# Patient Record
Sex: Male | Born: 1990 | Race: White | Hispanic: No | Marital: Single | Smoking: Current every day smoker
Health system: Southern US, Community
[De-identification: ages and names within clinical notes are randomized; demographics above are authoritative.]

## PROBLEM LIST (undated history)

## (undated) HISTORY — PX: FRACTURE SURGERY: SHX138

---

## 2018-01-29 ENCOUNTER — Emergency Department (HOSPITAL_COMMUNITY)
Admission: EM | Admit: 2018-01-29 | Discharge: 2018-01-29 | Disposition: A | Discharge status: Home / Self Care | Payer: Self-pay | Attending: Emergency Medicine | Admitting: Emergency Medicine

## 2018-01-29 ENCOUNTER — Encounter (HOSPITAL_COMMUNITY): Payer: Self-pay | Admitting: Emergency Medicine

## 2018-01-29 ENCOUNTER — Other Ambulatory Visit: Payer: Self-pay

## 2018-01-29 DIAGNOSIS — Z23 Encounter for immunization: Secondary | ICD-10-CM | POA: Insufficient documentation

## 2018-01-29 DIAGNOSIS — Y929 Unspecified place or not applicable: Secondary | ICD-10-CM | POA: Insufficient documentation

## 2018-01-29 DIAGNOSIS — T07XXXA Unspecified multiple injuries, initial encounter: Secondary | ICD-10-CM

## 2018-01-29 DIAGNOSIS — S60512A Abrasion of left hand, initial encounter: Secondary | ICD-10-CM | POA: Insufficient documentation

## 2018-01-29 DIAGNOSIS — S30810A Abrasion of lower back and pelvis, initial encounter: Secondary | ICD-10-CM | POA: Insufficient documentation

## 2018-01-29 DIAGNOSIS — Y9389 Activity, other specified: Secondary | ICD-10-CM | POA: Insufficient documentation

## 2018-01-29 DIAGNOSIS — S80212A Abrasion, left knee, initial encounter: Secondary | ICD-10-CM | POA: Insufficient documentation

## 2018-01-29 DIAGNOSIS — Y999 Unspecified external cause status: Secondary | ICD-10-CM | POA: Insufficient documentation

## 2018-01-29 MED ORDER — TETANUS-DIPHTH-ACELL PERTUSSIS 5-2.5-18.5 LF-MCG/0.5 IM SUSP
0.5000 mL | Freq: Once | INTRAMUSCULAR | Status: AC
  Administered 2018-01-29: 0.5 mL via INTRAMUSCULAR
  Filled 2018-01-29: qty 0.5
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
## 2018-01-29 NOTE — Discharge Instructions (Addendum)
Clean the abrasions with soap and water once or twice a day then apply a light coating of antibiotic ointment.  For pain take ibuprofen or Tylenol.  Return here or see the doctor of your choice as needed for problems.

## 2018-04-21 ENCOUNTER — Emergency Department (HOSPITAL_COMMUNITY): Payer: Self-pay

## 2018-04-21 ENCOUNTER — Emergency Department (HOSPITAL_COMMUNITY)
Admission: EM | Admit: 2018-04-21 | Discharge: 2018-04-21 | Disposition: A | Discharge status: Home / Self Care | Payer: Self-pay | Attending: Emergency Medicine | Admitting: Emergency Medicine

## 2018-04-21 ENCOUNTER — Other Ambulatory Visit: Payer: Self-pay

## 2018-04-21 ENCOUNTER — Encounter (HOSPITAL_COMMUNITY): Payer: Self-pay | Admitting: Emergency Medicine

## 2018-04-21 DIAGNOSIS — R197 Diarrhea, unspecified: Secondary | ICD-10-CM | POA: Insufficient documentation

## 2018-04-21 DIAGNOSIS — R112 Nausea with vomiting, unspecified: Secondary | ICD-10-CM | POA: Insufficient documentation

## 2018-04-21 DIAGNOSIS — F17210 Nicotine dependence, cigarettes, uncomplicated: Secondary | ICD-10-CM | POA: Insufficient documentation

## 2018-04-21 LAB — URINALYSIS, ROUTINE W REFLEX MICROSCOPIC
BILIRUBIN URINE: NEGATIVE
GLUCOSE, UA: NEGATIVE mg/dL
HGB URINE DIPSTICK: NEGATIVE
KETONES UR: NEGATIVE mg/dL
Leukocytes, UA: NEGATIVE
Nitrite: NEGATIVE
PH: 7 (ref 5.0–8.0)
Protein, ur: NEGATIVE mg/dL
SPECIFIC GRAVITY, URINE: 1.004 — AB (ref 1.005–1.030)

## 2018-04-21 LAB — CBC WITH DIFFERENTIAL/PLATELET
BASOS PCT: 0 %
Basophils Absolute: 0 10*3/uL (ref 0.0–0.1)
Eosinophils Absolute: 0.2 10*3/uL (ref 0.0–0.7)
Eosinophils Relative: 2 %
HEMATOCRIT: 47.2 % (ref 39.0–52.0)
HEMOGLOBIN: 16.9 g/dL (ref 13.0–17.0)
LYMPHS ABS: 2.1 10*3/uL (ref 0.7–4.0)
LYMPHS PCT: 22 %
MCH: 31.6 pg (ref 26.0–34.0)
MCHC: 35.8 g/dL (ref 30.0–36.0)
MCV: 88.4 fL (ref 78.0–100.0)
MONO ABS: 0.6 10*3/uL (ref 0.1–1.0)
MONOS PCT: 6 %
NEUTROS ABS: 6.6 10*3/uL (ref 1.7–7.7)
NEUTROS PCT: 70 %
Platelets: 193 10*3/uL (ref 150–400)
RBC: 5.34 MIL/uL (ref 4.22–5.81)
RDW: 11.9 % (ref 11.5–15.5)
WBC: 9.5 10*3/uL (ref 4.0–10.5)

## 2018-04-21 LAB — COMPREHENSIVE METABOLIC PANEL
ALBUMIN: 4 g/dL (ref 3.5–5.0)
ALT: 14 U/L (ref 0–44)
AST: 16 U/L (ref 15–41)
Alkaline Phosphatase: 48 U/L (ref 38–126)
Anion gap: 6 (ref 5–15)
BILIRUBIN TOTAL: 0.9 mg/dL (ref 0.3–1.2)
BUN: 12 mg/dL (ref 6–20)
CO2: 27 mmol/L (ref 22–32)
Calcium: 9 mg/dL (ref 8.9–10.3)
Chloride: 105 mmol/L (ref 98–111)
Creatinine, Ser: 1 mg/dL (ref 0.61–1.24)
GFR calc Af Amer: >60 mL/min (ref >60)
GFR calc non Af Amer: >60 mL/min (ref >60)
GLUCOSE: 103 mg/dL — AB (ref 70–99)
Potassium: 3.8 mmol/L (ref 3.5–5.1)
SODIUM: 138 mmol/L (ref 135–145)
TOTAL PROTEIN: 7 g/dL (ref 6.5–8.1)

## 2018-04-21 LAB — LIPASE, BLOOD: Lipase: 54 U/L — ABNORMAL HIGH (ref 11–51)

## 2018-04-21 MED ORDER — ONDANSETRON HCL 4 MG/2ML IJ SOLN
4.0000 mg | Freq: Once | INTRAMUSCULAR | Status: AC
  Administered 2018-04-21: 4 mg via INTRAVENOUS
  Filled 2018-04-21: qty 2

## 2018-04-21 MED ORDER — ONDANSETRON HCL 4 MG PO TABS: 4.0000 mg | ORAL_TABLET | Freq: Four times a day (QID) | ORAL | 0 refills | Status: AC

## 2018-04-21 MED ORDER — IOPAMIDOL (ISOVUE-300) INJECTION 61%
100.0000 mL | Freq: Once | INTRAVENOUS | Status: AC | PRN
  Administered 2018-04-21: 100 mL via INTRAVENOUS

## 2018-04-21 MED ORDER — SODIUM CHLORIDE 0.9 % IV BOLUS
1000.0000 mL | Freq: Once | INTRAVENOUS | Status: AC
  Administered 2018-04-21: 1000 mL via INTRAVENOUS

## 2018-04-21 NOTE — ED Notes (Signed)
Pt asking for urinal

## 2018-04-21 NOTE — Discharge Instructions (Signed)
Frequent small sips of clear fluids today, then bland diet as tolerated.  Follow-up with your primary doctor or return here for any worsening symptoms such as increasing pain, fever, or continued vomiting and diarrhea lasting more than 3-4 more days

## 2018-04-21 NOTE — ED Triage Notes (Signed)
Pt states vomiting and diarrhea since Wednesday off and on. Bloating feeling. States family sick with same. Pt reports weakness

## 2018-04-21 NOTE — ED Notes (Signed)
Pt in CT.

## 2018-04-21 NOTE — ED Provider Notes (Signed)
[facility] EMERGENCY DEPARTMENT Provider Note   CSN: [id number] Arrival date & time: 04/21/18  [id number]     History   Chief Complaint No chief complaint on file.   HPI Ron ParkerRobert Pruss is a 27 y.o. male.  HPI     Ron ParkerRobert Buzby is a 27 y.o. male who presents to the Emergency Department complaining of vomiting, diarrhea, and generalized weakness.  Symptoms have been present for 1 week.  He reports vomiting and diarrhea have been intermittent and associated with some mild tenderness of his lower abdomen.  He states that he is unable to eat solid foods without vomiting.  He is tolerating small amounts of water.  He describes a "bloating" feeling of his abdomen.  He reports weakness after vomiting.  He denies fever, chills, urinary symptoms, back pain and recent antibiotics.  He states that his sister recently had similar symptoms.  No history of abdominal surgeries.    No past medical history on file.  There are no active problems to display for this patient.   Past Surgical History:  Procedure Laterality Date  . FRACTURE SURGERY        Home Medications    Prior to Admission medications   Not on File    Family History No family history on file.  Social History Social History   Tobacco Use  . Smoking status: Current Every Day Smoker    Packs/day: 0.50    Types: Cigarettes  . Smokeless tobacco: Current User    Types: Snuff  Substance Use Topics  . Alcohol use: Yes  . Drug use: Never     Allergies   Codeine; Penicillins; Erythromycin; and Sulfa antibiotics   Review of Systems Review of Systems  Constitutional: Positive for fatigue. Negative for appetite change, chills and fever.  HENT: Negative for sore throat.   Respiratory: Negative for shortness of breath.   Cardiovascular: Negative for chest pain.  Gastrointestinal: Positive for abdominal pain, diarrhea, nausea and vomiting. Negative for blood in stool.  Genitourinary: Negative for decreased urine  volume, difficulty urinating, dysuria and flank pain.  Musculoskeletal: Negative for back pain.  Skin: Negative for color change and rash.  Neurological: Negative for dizziness, weakness (generalized weakness) and numbness.  Hematological: Negative for adenopathy.  All other systems reviewed and are negative.    Physical Exam Updated Vital Signs BP 135/90 (BP Location: Right Arm)   Pulse 85   Temp 98.7 F (37.1 C) (Oral)   Resp 18   Ht 6' (1.829 m)   Wt 88.5 kg (195 lb)   SpO2 100%   BMI 26.45 kg/m   Physical Exam  Constitutional: He appears well-developed and well-nourished. No distress.  HENT:  Head: Normocephalic and atraumatic.  Mucous membranes are dry  Eyes: Conjunctivae are normal.  Cardiovascular: Normal rate, regular rhythm and intact distal pulses.  No murmur heard. Pulmonary/Chest: Effort normal and breath sounds normal. No respiratory distress. He exhibits no tenderness.  Abdominal: Soft. Bowel sounds are normal. He exhibits no distension and no mass. There is tenderness. There is no rebound and no guarding.  Mild ttp of the RLQ.  Abdomen is otherwise soft.  No guarding or rebound tenderness.   Musculoskeletal: Normal range of motion. He exhibits no edema.  Neurological: He is alert. No sensory deficit. Coordination normal.  Skin: Skin is warm. Capillary refill takes less than 2 seconds. No rash noted.  Psychiatric: He has a normal mood and affect.  Nursing note and vitals reviewed.    ED  Treatments / Results  Labs (all labs ordered are listed, but only abnormal results are displayed) Labs Reviewed  COMPREHENSIVE METABOLIC PANEL - Abnormal; Notable for the following components:      Result Value   Glucose, Bld 103 (*)    All other components within normal limits  LIPASE, BLOOD - Abnormal; Notable for the following components:   Lipase 54 (*)    All other components within normal limits  URINALYSIS, ROUTINE W REFLEX MICROSCOPIC - Abnormal; Notable for  the following components:   Color, Urine STRAW (*)    Specific Gravity, Urine 1.004 (*)    All other components within normal limits  CBC WITH DIFFERENTIAL/PLATELET    EKG None  Radiology Ct Abdomen Pelvis W Contrast  Result Date: 04/21/2018 CLINICAL DATA:  Right lower quadrant abdominal pain for 4 days. No fever, diarrhea or constipation. No previous relevant surgery. EXAM: CT ABDOMEN AND PELVIS WITH CONTRAST TECHNIQUE: Multidetector CT imaging of the abdomen and pelvis was performed using the standard protocol following bolus administration of intravenous contrast. CONTRAST:  ISOVUE-300 IOPAMIDOL (ISOVUE-300) INJECTION 61% COMPARISON:  None. FINDINGS: Lower chest: Clear lung bases. No significant pleural or pericardial effusion. Hepatobiliary: The liver is normal in density without focal abnormality. No evidence of gallstones, gallbladder wall thickening or biliary dilatation. Pancreas: Unremarkable. No pancreatic ductal dilatation or surrounding inflammatory changes. Spleen: Normal in size without focal abnormality. Adrenals/Urinary Tract: Both adrenal glands appear normal. 9 mm low-density lesion in the lower pole of the left kidney on image 36/2 is likely a small cyst. The kidneys otherwise appear normal. No evidence of urinary tract calculus, hydronephrosis or perinephric soft tissue stranding. The bladder appears normal. Stomach/Bowel: No evidence of bowel wall thickening, distention or surrounding inflammatory change. The appendix appears normal. Vascular/Lymphatic: There are no enlarged abdominal or pelvic lymph nodes. No significant vascular findings. Reproductive: The prostate gland and seminal vesicles appear normal. Other: No evidence of abdominal wall mass or hernia. No ascites. Musculoskeletal: No acute or significant osseous findings. 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 IMPRESSION: No acute findings or explanation for the patient's  symptoms. The appendix appears normal. Electronically Signed   By: Carey Bullocks M.D.   On: 04/21/2018 10:50     Procedures Procedures (including critical care time)  Medications Ordered in ED Medications - No data to display   Initial Impression / Assessment and Plan / ED Course  I have reviewed the triage vital signs and the nursing notes.  Pertinent labs & imaging results that were available during my care of the patient were reviewed by me and considered in my medical decision making (see chart for details).     Patient with intermittent vomiting and diarrhea for 1 week.  Well-appearing.  Afebrile.  No history of recent antibiotic use.  Tolerating small amounts of water.  Right lower quadrant pain on exam, this is concerning for possible appendicitis will obtain labs and CT abdomen pelvis.  On recheck, patient reports feeling better.  He has tolerated crackers and oral fluids.  Labs and CT of abdomen pelvis are reassuring.  Feel that symptoms are viral.  No diarrhea during ER stay.  Patient agrees to symptomatic treatment plan and close PCP follow-up, I have discussed return precautions.    Final Clinical Impressions(s) / ED Diagnoses   Final diagnoses:  Nausea vomiting and diarrhea    ED Discharge Orders    None       Pauline Aus, PA-C 04/24/18 1522  Benjiman Core, MD 04/24/18 1616

## 2018-11-01 ENCOUNTER — Other Ambulatory Visit (HOSPITAL_COMMUNITY): Payer: Self-pay | Admitting: Preventative Medicine

## 2018-11-01 DIAGNOSIS — S9032XA Contusion of left foot, initial encounter: Secondary | ICD-10-CM

## 2018-11-02 ENCOUNTER — Encounter (HOSPITAL_COMMUNITY)
Admission: RE | Admit: 2018-11-02 | Discharge: 2018-11-02 | Disposition: A | Discharge status: Home / Self Care | Payer: Self-pay | Source: Ambulatory Visit | Attending: Preventative Medicine | Admitting: Preventative Medicine

## 2018-11-02 DIAGNOSIS — S9032XA Contusion of left foot, initial encounter: Secondary | ICD-10-CM | POA: Insufficient documentation

## 2018-11-02 MED ORDER — TECHNETIUM TC 99M MEDRONATE IV KIT
20.0000 | PACK | Freq: Once | INTRAVENOUS | Status: AC | PRN
  Administered 2018-11-02: 18 via INTRAVENOUS

## 2018-11-30 ENCOUNTER — Emergency Department (HOSPITAL_COMMUNITY)
Admission: EM | Admit: 2018-11-30 | Discharge: 2018-12-01 | Disposition: A | Discharge status: Home / Self Care | Payer: Self-pay | Attending: Emergency Medicine | Admitting: Emergency Medicine

## 2018-11-30 ENCOUNTER — Other Ambulatory Visit: Payer: Self-pay

## 2018-11-30 ENCOUNTER — Encounter (HOSPITAL_COMMUNITY): Payer: Self-pay | Admitting: Emergency Medicine

## 2018-11-30 DIAGNOSIS — F17210 Nicotine dependence, cigarettes, uncomplicated: Secondary | ICD-10-CM | POA: Insufficient documentation

## 2018-11-30 DIAGNOSIS — J00 Acute nasopharyngitis [common cold]: Secondary | ICD-10-CM | POA: Insufficient documentation

## 2018-11-30 LAB — GROUP A STREP BY PCR: Group A Strep by PCR: NOT DETECTED

## 2018-11-30 NOTE — ED Triage Notes (Signed)
Patient complaining of sore throat x 2 days. 

## 2018-12-01 MED ORDER — BENZONATATE 100 MG PO CAPS
200.0000 mg | ORAL_CAPSULE | Freq: Once | ORAL | Status: AC
  Administered 2018-12-01: 200 mg via ORAL
  Filled 2018-12-01: qty 2

## 2018-12-01 MED ORDER — BENZONATATE 100 MG PO CAPS: 200.0000 mg | ORAL_CAPSULE | Freq: Three times a day (TID) | ORAL | 0 refills | Status: AC | PRN

## 2018-12-01 NOTE — Discharge Instructions (Addendum)
Rest,  Drink plenty of fluids.  Take motrin or tylenol if needed for throat pain and fever reduction.  Tessalon perles can help with your cough symptom.   Get rechecked for any worsening symptoms including shortness of breath or weakness.

## 2018-12-01 NOTE — ED Provider Notes (Signed)
[facility] EMERGENCY DEPARTMENT Provider Note   CSN: [id number] Arrival date & time: 11/30/18  2202    History   Chief Complaint Chief Complaint  Patient presents with  . Sore Throat    HPI Clinton Flynn is a 28 y.o. male  Presenting with a 2 day history of uri type symptoms which includes nasal congestion with clear rhinorrhea, sore throat and a mild nonproductive cough.  His throat pain is worse when he first wakes in the morning and is associated with episodic laryngitis which improves throughout the day.  Symptoms do not include shortness of breath, chest pain,  Nausea, vomiting or diarrhea.  The patient has use warm salt gargles  prior to arrival with no significant improvement in symptoms.      The history is provided by the patient.    History reviewed. No pertinent past medical history.  There are no active problems to display for this patient.   Past Surgical History:  Procedure Laterality Date  . FRACTURE SURGERY          Home Medications    Prior to Admission medications   Medication Sig Start Date End Date Taking? Authorizing Provider  benzonatate (TESSALON) 100 MG capsule Take 2 capsules (200 mg total) by mouth 3 (three) times daily as needed. 12/01/18   Haadi Santellan, Raynelle Fanning, PA-C  ondansetron (ZOFRAN) 4 MG tablet Take 1 tablet (4 mg total) by mouth every 6 (six) hours. 04/21/18   Pauline Aus, PA-C    Family History History reviewed. No pertinent family history.  Social History Social History   Tobacco Use  . Smoking status: Current Every Day Smoker    Packs/day: 0.50    Types: Cigarettes  . Smokeless tobacco: Current User    Types: Snuff  Substance Use Topics  . Alcohol use: Yes    Comment: occasionally  . Drug use: Never     Allergies   Codeine; Penicillins; Erythromycin; and Sulfa antibiotics   Review of Systems Review of Systems  Constitutional: Negative for chills and fever.  HENT: Positive for congestion, rhinorrhea, sore throat  and voice change. Negative for ear pain, sinus pressure and trouble swallowing.   Eyes: Negative for discharge.  Respiratory: Positive for cough. Negative for shortness of breath, wheezing and stridor.   Cardiovascular: Negative for chest pain.  Gastrointestinal: Negative for abdominal pain.  Genitourinary: Negative.      Physical Exam Updated Vital Signs BP 126/81   Pulse 76   Temp 98.1 F (36.7 C) (Oral)   Resp 18   Ht 6' (1.829 m)   Wt 90.7 kg   SpO2 98%   BMI 27.12 kg/m   Physical Exam Constitutional:      Appearance: He is well-developed.  HENT:     Head: Normocephalic and atraumatic.     Right Ear: Tympanic membrane and ear canal normal.     Left Ear: Tympanic membrane and ear canal normal.     Nose: Mucosal edema and rhinorrhea present.     Mouth/Throat:     Mouth: Mucous membranes are moist.     Pharynx: Uvula midline. Posterior oropharyngeal erythema present. No oropharyngeal exudate or uvula swelling.     Tonsils: No tonsillar abscesses.  Eyes:     Conjunctiva/sclera: Conjunctivae normal.  Cardiovascular:     Rate and Rhythm: Normal rate.     Heart sounds: Normal heart sounds.  Pulmonary:     Effort: Pulmonary effort is normal. No respiratory distress.     Breath sounds: No  stridor. No wheezing, rhonchi or rales.  Musculoskeletal: Normal range of motion.  Skin:    General: Skin is warm and dry.     Findings: No rash.  Neurological:     Mental Status: He is alert and oriented to person, place, and time.      ED Treatments / Results  Labs (all labs ordered are listed, but only abnormal results are displayed) Labs Reviewed  GROUP A STREP BY PCR    EKG None  Radiology No results found.  Procedures Procedures (including critical care time)  Medications Ordered in ED Medications  benzonatate (TESSALON) capsule 200 mg (200 mg Oral Given 12/01/18 0021)     Initial Impression / Assessment and Plan / ED Course  I have reviewed the triage vital  signs and the nursing notes.  Pertinent labs & imaging results that were available during my care of the patient were reviewed by me and considered in my medical decision making (see chart for details).        Strep negative. Exam and hx suggesting viral uri. Discussed supportive home care tx for sx. Motrin or tylenol, suggested otc  Antihistamine/decongestant. Tessalon for cough. Prn f/u anticipated.  Final Clinical Impressions(s) / ED Diagnoses   Final diagnoses:  Acute nasopharyngitis    ED Discharge Orders         Ordered    benzonatate (TESSALON) 100 MG capsule  3 times daily PRN     12/01/18 0005           Burgess Amor, PA-C 12/01/18 1228    Glynn Octave, MD 12/01/18 [phone]

## 2020-08-17 IMAGING — NM NM BONE 3 PHASE
7 of 10 series · 13 of 30 positions shown · non-contrast
Comparison: None

Radiographic correlation: None

CLINICAL DATA: LEFT foot pain, redness and warmth after working all
day, slab of granite fell on LEFT foot 1 week ago, LEFT foot
contusion initial encounter

EXAM:
NUCLEAR MEDICINE 3-PHASE BONE SCAN
TECHNIQUE: Radionuclide angiographic images, immediate static blood pool
images, and 3-hour delayed static images were obtained of the feet
after intravenous injection of radiopharmaceutical.
RADIOPHARMACEUTICALS:  None mCi Hc-55m MDP IV

[Series 1: (id) pages 1 at 2_18_(id) 12_[id number] pm · 3 of 10 frames shown]
[frame 3/10]
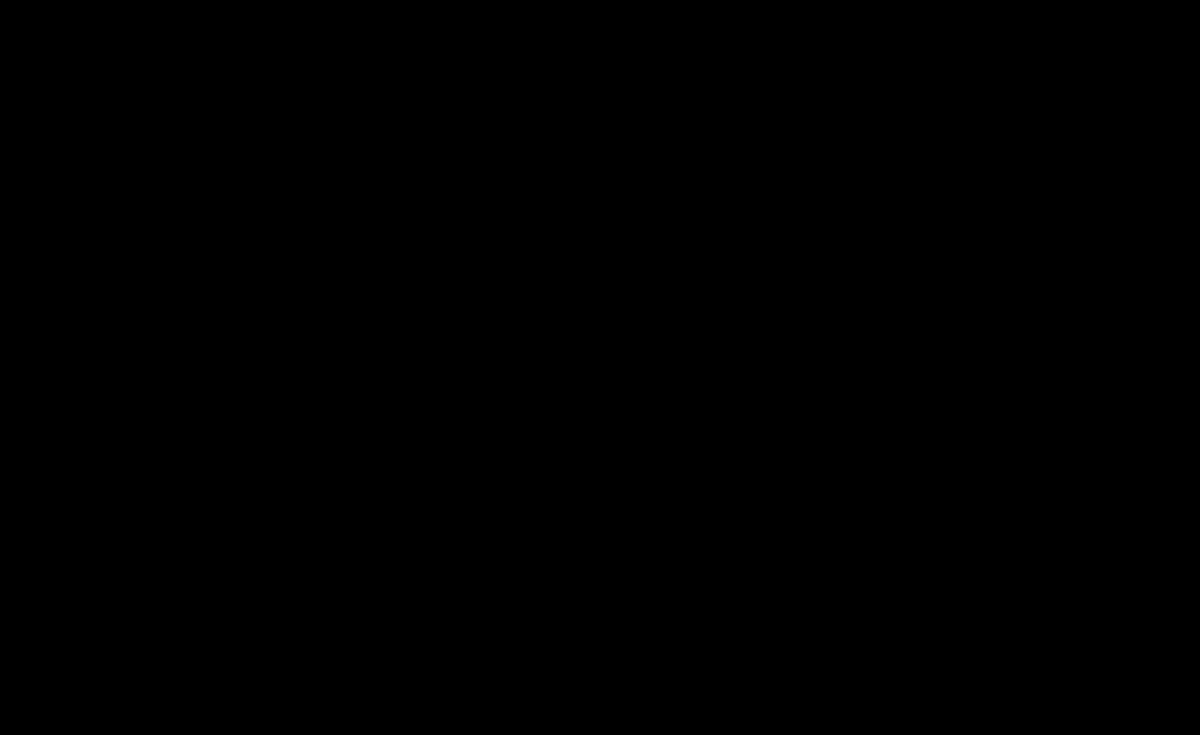
[frame 6/10]
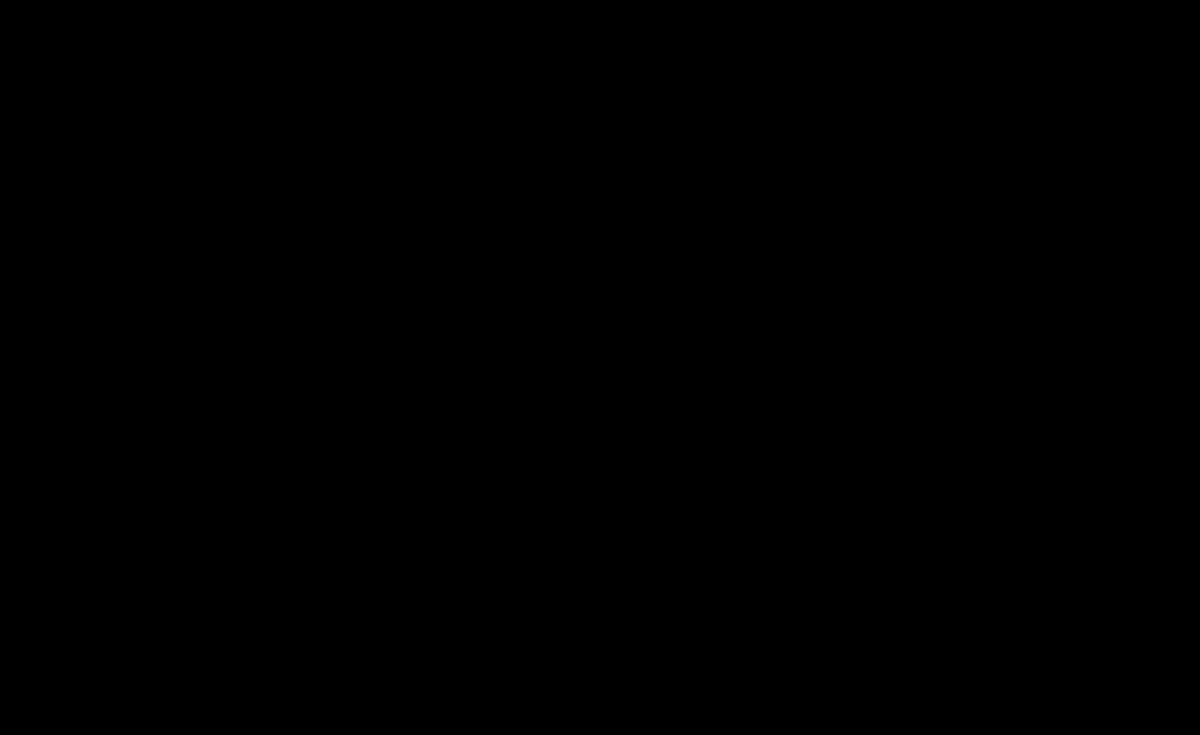
[frame 10/10]
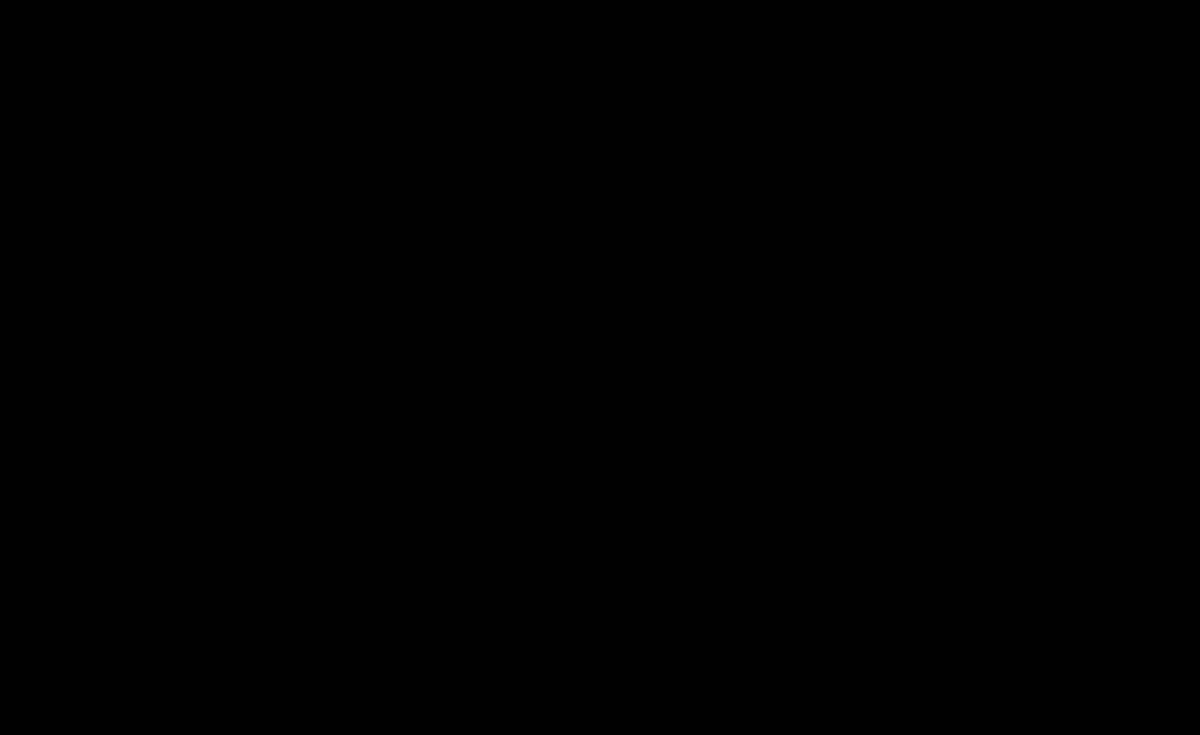

[Series 1: (id) pages 2 at 2_18_(id) 12_[id number] pm · 2 of 10 frames shown]
[frame 5/10]
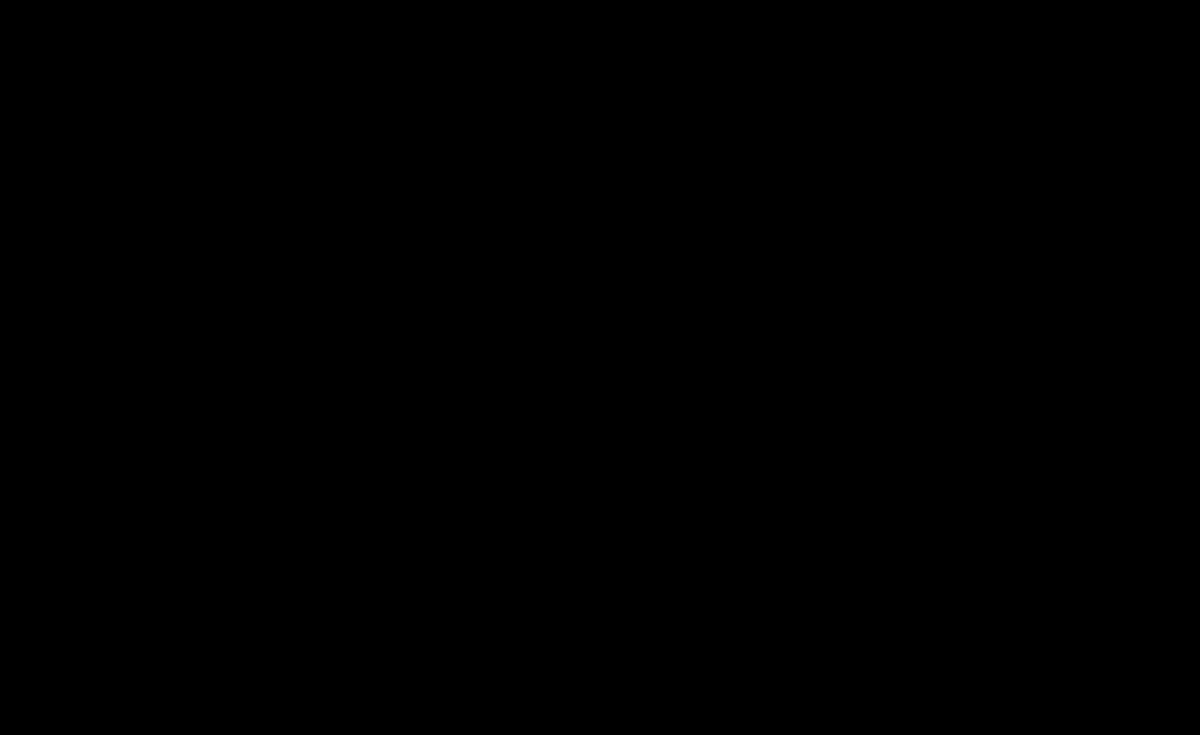
[frame 8/10]
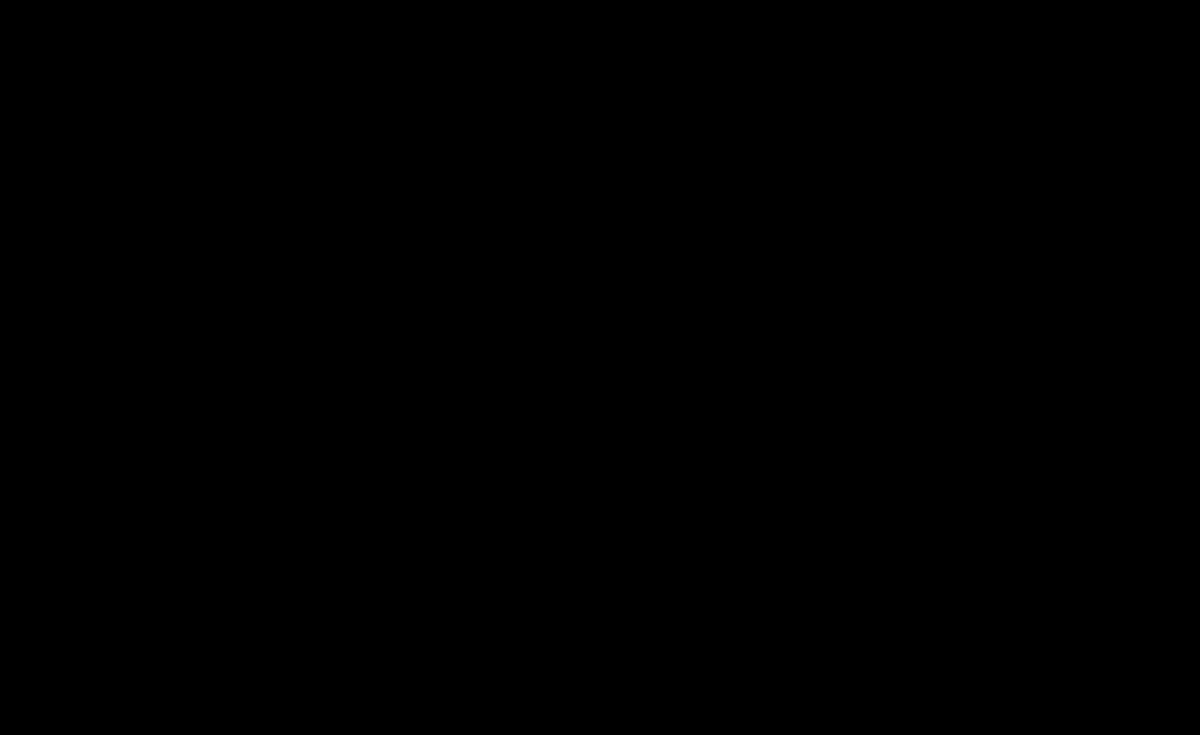

[Series 1: flow · 2.07mm/px · 3 of 15 frames shown (1 of 2)]
[frame 2/15]
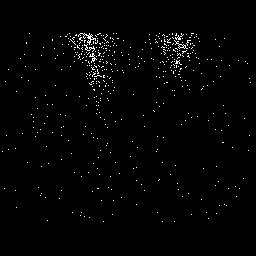
[frame 9/15]
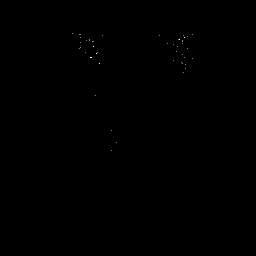
[frame 14/15]
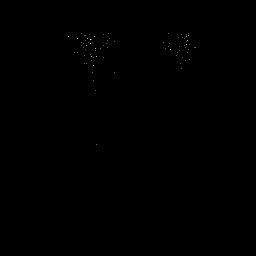

[Series 1: flow · 2.07mm/px · 2 of 15 frames shown (2 of 2)]
[frame 4/15]
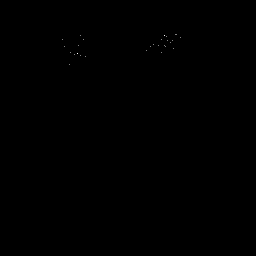
[frame 9/15]
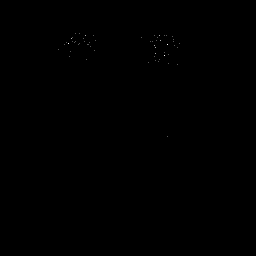

[Series 2: blood pool · 2.07mm/px · 1 of 1 slices shown]
[im 1/1  full-range]
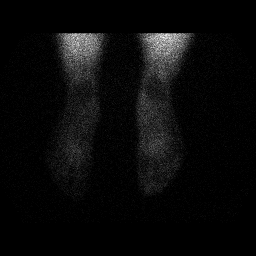

[Series 3: delay · delayed · 2.07mm/px · 1 of 1 slices shown (1 of 2)]
[im 1/1  full-range]
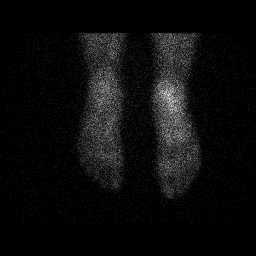

[Series 4: delay · delayed · 2.07mm/px · 1 of 1 slices shown (2 of 2)]
[im 1/1  full-range]
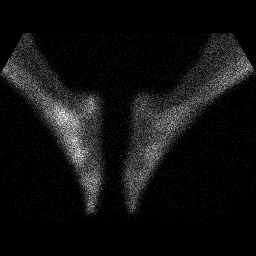

[13 of 30 positions shown; findings below may reference images not displayed]

FINDINGS: Vascular phase: Minimally increased blood flow to RIGHT foot
diffusely versus LEFT

Blood pool phase: Minimally increased blood pool in RIGHT foot
versus LEFT

Delayed phase: Mildly increased tracer localization throughout
osseous structures of the RIGHT foot versus LEFT compatible with
mild RIGHT foot hyperemia and increased delivery of tracer. No focal
areas of increased osseous tracer accumulation are identified in
either foot to suggest occult fracture or infection.
IMPRESSION: Mild hyperemia to the RIGHT foot versus LEFT on blood flow and blood
pool images, which likely accounts for mild diffuse increase in
tracer localization throughout the RIGHT foot versus LEFT.

No focal areas of abnormal osseous tracer accumulation are seen in
either foot to suggest occult fracture or infection.

If patient has persistent focal symptoms, consider MR imaging.

## 2021-01-30 ENCOUNTER — Emergency Department (HOSPITAL_COMMUNITY)
Admission: EM | Admit: 2021-01-30 | Discharge: 2021-01-30 | Disposition: A | Discharge status: Home / Self Care | Payer: Self-pay | Attending: Emergency Medicine | Admitting: Emergency Medicine

## 2021-01-30 ENCOUNTER — Emergency Department (HOSPITAL_COMMUNITY): Payer: Self-pay

## 2021-01-30 ENCOUNTER — Other Ambulatory Visit: Payer: Self-pay

## 2021-01-30 ENCOUNTER — Encounter (HOSPITAL_COMMUNITY): Payer: Self-pay

## 2021-01-30 DIAGNOSIS — F17210 Nicotine dependence, cigarettes, uncomplicated: Secondary | ICD-10-CM | POA: Insufficient documentation

## 2021-01-30 DIAGNOSIS — N132 Hydronephrosis with renal and ureteral calculous obstruction: Secondary | ICD-10-CM | POA: Insufficient documentation

## 2021-01-30 DIAGNOSIS — N200 Calculus of kidney: Secondary | ICD-10-CM

## 2021-01-30 LAB — URINALYSIS, ROUTINE W REFLEX MICROSCOPIC
Bacteria, UA: NONE SEEN
Bilirubin Urine: NEGATIVE
Glucose, UA: NEGATIVE mg/dL
Ketones, ur: NEGATIVE mg/dL
Leukocytes,Ua: NEGATIVE
Nitrite: NEGATIVE
Protein, ur: 100 mg/dL — AB
RBC / HPF: >50 RBC/hpf — ABNORMAL HIGH (ref 0–5)
Specific Gravity, Urine: 1.021 (ref 1.005–1.030)
pH: 6 (ref 5.0–8.0)

## 2021-01-30 LAB — CBC WITH DIFFERENTIAL/PLATELET
Abs Immature Granulocytes: 0.05 10*3/uL (ref 0.00–0.07)
Basophils Absolute: 0.1 10*3/uL (ref 0.0–0.1)
Basophils Relative: 1 %
Eosinophils Absolute: 0.3 10*3/uL (ref 0.0–0.5)
Eosinophils Relative: 2 %
HCT: 48.7 % (ref 39.0–52.0)
Hemoglobin: 16.3 g/dL (ref 13.0–17.0)
Immature Granulocytes: 0 %
Lymphocytes Relative: 37 %
Lymphs Abs: 5.1 10*3/uL — ABNORMAL HIGH (ref 0.7–4.0)
MCH: 30.4 pg (ref 26.0–34.0)
MCHC: 33.5 g/dL (ref 30.0–36.0)
MCV: 90.9 fL (ref 80.0–100.0)
Monocytes Absolute: 0.9 10*3/uL (ref 0.1–1.0)
Monocytes Relative: 7 %
Neutro Abs: 7.3 10*3/uL (ref 1.7–7.7)
Neutrophils Relative %: 53 %
Platelets: 294 10*3/uL (ref 150–400)
RBC: 5.36 MIL/uL (ref 4.22–5.81)
RDW: 12.1 % (ref 11.5–15.5)
WBC: 13.8 10*3/uL — ABNORMAL HIGH (ref 4.0–10.5)
nRBC: 0 % (ref 0.0–0.2)

## 2021-01-30 LAB — BASIC METABOLIC PANEL
Anion gap: 9 (ref 5–15)
BUN: 16 mg/dL (ref 6–20)
CO2: 28 mmol/L (ref 22–32)
Calcium: 9.3 mg/dL (ref 8.9–10.3)
Chloride: 102 mmol/L (ref 98–111)
Creatinine, Ser: 1.16 mg/dL (ref 0.61–1.24)
GFR, Estimated: >60 mL/min (ref >60)
Glucose, Bld: 115 mg/dL — ABNORMAL HIGH (ref 70–99)
Potassium: 3 mmol/L — ABNORMAL LOW (ref 3.5–5.1)
Sodium: 139 mmol/L (ref 135–145)

## 2021-01-30 MED ORDER — ONDANSETRON HCL 4 MG/2ML IJ SOLN
4.0000 mg | Freq: Once | INTRAMUSCULAR | Status: AC
  Administered 2021-01-30: 4 mg via INTRAVENOUS
  Filled 2021-01-30: qty 2

## 2021-01-30 MED ORDER — SODIUM CHLORIDE 0.9 % IV BOLUS
1000.0000 mL | Freq: Once | INTRAVENOUS | Status: AC
  Administered 2021-01-30: 1000 mL via INTRAVENOUS

## 2021-01-30 MED ORDER — KETOROLAC TROMETHAMINE 30 MG/ML IJ SOLN
15.0000 mg | Freq: Once | INTRAMUSCULAR | Status: AC
  Administered 2021-01-30: 15 mg via INTRAVENOUS
  Filled 2021-01-30: qty 1

## 2021-01-30 MED ORDER — ONDANSETRON 4 MG PO TBDP: 4.0000 mg | ORAL_TABLET | Freq: Three times a day (TID) | ORAL | 0 refills | Status: AC | PRN

## 2021-01-30 MED ORDER — MORPHINE SULFATE (PF) 4 MG/ML IV SOLN
4.0000 mg | Freq: Once | INTRAVENOUS | Status: AC
  Administered 2021-01-30: 4 mg via INTRAVENOUS
  Filled 2021-01-30: qty 1

## 2021-01-30 MED ORDER — OXYCODONE-ACETAMINOPHEN 5-325 MG PO TABS: 1.0000 | ORAL_TABLET | Freq: Four times a day (QID) | ORAL | 0 refills | Status: AC | PRN

## 2021-01-30 NOTE — ED Provider Notes (Signed)
[facility] EMERGENCY DEPARTMENT Provider Note   CSN: [id number] Arrival date & time: 01/30/21  1245     History Chief Complaint  Patient presents with  . Flank Pain    Left Flank Pain    Clinton Flynn is a 30 y.o. male.  HPI     This is a 30 year old male with no reported past medical history presents with left flank pain.  Reports onset of symptoms around 230 this morning.  He reports acute onset of symptoms.  He states that he has 10 out of 10 pain in the left flank that radiates into the left lower abdomen.  He reports associated urinary frequency and dysuria.  He states "I feel like something strain to pass."  He has no personal history of kidney stones but states that they run in his family.  He is not had any fevers.  He is not taking anything for the pain.  He reports nausea without vomiting.  History reviewed. No pertinent past medical history.  There are no problems to display for this patient.   Past Surgical History:  Procedure Laterality Date  . FRACTURE SURGERY         History reviewed. No pertinent family history.  Social History   Tobacco Use  . Smoking status: Current Every Day Smoker    Packs/day: 0.50    Types: Cigarettes  . Smokeless tobacco: Current User    Types: Snuff  Substance Use Topics  . Alcohol use: Yes    Comment: occasionally  . Drug use: Never    Home Medications Prior to Admission medications   Medication Sig Start Date End Date Taking? Authorizing Provider  ondansetron (ZOFRAN ODT) 4 MG disintegrating tablet Take 1 tablet (4 mg total) by mouth every 8 (eight) hours as needed for nausea or vomiting. 01/30/21  Yes Jon Lall, Mayer Masker, MD  oxyCODONE-acetaminophen (PERCOCET/ROXICET) 5-325 MG tablet Take 1 tablet by mouth every 6 (six) hours as needed for severe pain. 01/30/21  Yes Deshannon Seide, Mayer Masker, MD  benzonatate (TESSALON) 100 MG capsule Take 2 capsules (200 mg total) by mouth 3 (three) times daily as needed. 12/01/18   Idol,  Raynelle Fanning, PA-C  ondansetron (ZOFRAN) 4 MG tablet Take 1 tablet (4 mg total) by mouth every 6 (six) hours. 04/21/18   Triplett, Tammy, PA-C    Allergies    Codeine, Penicillins, Erythromycin, and Sulfa antibiotics  Review of Systems   Review of Systems  Constitutional: Negative for fever.  Respiratory: Negative for shortness of breath.   Gastrointestinal: Positive for nausea. Negative for abdominal pain.  Genitourinary: Positive for dysuria, flank pain and frequency.  All other systems reviewed and are negative.   Physical Exam Updated Vital Signs BP 132/86 (BP Location: Left Arm)   Pulse 86   Temp 98 F (36.7 C) (Oral)   Ht 1.753 m (5\' 9" )   Wt 90.7 kg   SpO2 100%   BMI 29.53 kg/m   Physical Exam Vitals and nursing note reviewed.  Constitutional:      Appearance: He is well-developed.     Comments: Uncomfortable appearing but nontoxic  HENT:     Head: Normocephalic and atraumatic.     Nose: Nose normal.     Mouth/Throat:     Mouth: Mucous membranes are moist.  Eyes:     Pupils: Pupils are equal, round, and reactive to light.  Cardiovascular:     Rate and Rhythm: Normal rate and regular rhythm.     Heart sounds: Normal heart  sounds. No murmur heard.   Pulmonary:     Effort: Pulmonary effort is normal. No respiratory distress.     Breath sounds: Normal breath sounds. No wheezing.  Abdominal:     General: Bowel sounds are normal.     Palpations: Abdomen is soft.     Tenderness: There is no abdominal tenderness. There is left CVA tenderness. There is no right CVA tenderness or rebound.  Musculoskeletal:     Cervical back: Neck supple.     Right lower leg: No edema.     Left lower leg: No edema.  Lymphadenopathy:     Cervical: No cervical adenopathy.  Skin:    General: Skin is warm and dry.  Neurological:     Mental Status: He is alert and oriented to person, place, and time.  Psychiatric:        Mood and Affect: Mood normal.     ED Results / Procedures /  Treatments   Labs (all labs ordered are listed, but only abnormal results are displayed) Labs Reviewed  URINALYSIS, ROUTINE W REFLEX MICROSCOPIC - Abnormal; Notable for the following components:      Result Value   APPearance HAZY (*)    Hgb urine dipstick LARGE (*)    Protein, ur 100 (*)    RBC / HPF >50 (*)    All other components within normal limits  CBC WITH DIFFERENTIAL/PLATELET - Abnormal; Notable for the following components:   WBC 13.8 (*)    Lymphs Abs 5.1 (*)    All other components within normal limits  BASIC METABOLIC PANEL - Abnormal; Notable for the following components:   Potassium 3.0 (*)    Glucose, Bld 115 (*)    All other components within normal limits    EKG None  Radiology CT Renal Stone Study  Result Date: 01/30/2021 CLINICAL DATA:  Left flank pain, dysuria EXAM: CT ABDOMEN AND PELVIS WITHOUT CONTRAST TECHNIQUE: Multidetector CT imaging of the abdomen and pelvis was performed following the standard protocol without IV contrast. COMPARISON:  04/21/2018 FINDINGS: Lower chest: The visualized lung bases are clear. The visualized heart and pericardium are unremarkable. Hepatobiliary: No focal liver abnormality is seen. No gallstones, gallbladder wall thickening, or biliary dilatation. Pancreas: Unremarkable Spleen: Unremarkable Adrenals/Urinary Tract: The adrenal glands are unremarkable. The kidneys are normal in size and position. There is mild left hydronephrosis secondary to an obstructing 3 mm calculus within the distal left ureter just proximal to the left ureterovesicular junction. No additional nephro or urolithiasis. No hydronephrosis on the right. The bladder is largely decompressed and is unremarkable. Stomach/Bowel: Stomach is within normal limits. Appendix appears normal. No evidence of bowel wall thickening, distention, or inflammatory changes. Vascular/Lymphatic: The abdominal vasculature is unremarkable. No pathologic adenopathy within the abdomen and  pelvis. Reproductive: Prostate is unremarkable. Other: No abdominal wall hernia. Musculoskeletal: No acute bone abnormality. No lytic or blastic bone lesions are identified. Multiple Schmorl's nodes are noted within the visualized lower thoracic spine. IMPRESSION: 3 mm obstructing calculus within the distal left ureter resulting in mild left hydronephrosis. Electronically Signed   By: Helyn Numbers MD   On: 01/30/2021 04:22    Procedures Procedures   Medications Ordered in ED Medications  sodium chloride 0.9 % bolus 1,000 mL (1,000 mLs Intravenous New Bag/Given 01/30/21 0346)  morphine 4 MG/ML injection 4 mg (4 mg Intravenous Given 01/30/21 0347)  ondansetron (ZOFRAN) injection 4 mg (4 mg Intravenous Given 01/30/21 0347)  ketorolac (TORADOL) 30 MG/ML injection 15 mg (15  mg Intravenous Given 01/30/21 0347)    ED Course  I have reviewed the triage vital signs and the nursing notes.  Pertinent labs & imaging results that were available during my care of the patient were reviewed by me and considered in my medical decision making (see chart for details).    MDM Rules/Calculators/A&P                          Patient presents with left flank pain.  He is overall nontoxic and vital signs are reassuring.  He does appear uncomfortable.  History is most consistent with likely kidney stone.  UTI is also a consideration.  Patient was given pain and nausea medication as well as fluids.  Labs were obtained as well as a CT stone study.  Urinalysis with red cells present.  CBC with slight leukocytosis but no left shift.  K slightly low at 3.0.  CT stone study shows a 3 mm obstructing left stone.  This is likely the cause of symptoms.  No evidence of superimposed UTI.  On recheck, patient is significantly improved.  We discussed expectant management.  Will discharge with some pain medication and Zofran.  He was provided with urology follow-up.  After history, exam, and medical workup I feel the patient has been  appropriately medically screened and is safe for discharge home. Pertinent diagnoses were discussed with the patient. Patient was given return precautions.  Final Clinical Impression(s) / ED Diagnoses Final diagnoses:  Kidney stone    Rx / DC Orders ED Discharge Orders         Ordered    oxyCODONE-acetaminophen (PERCOCET/ROXICET) 5-325 MG tablet  Every 6 hours PRN        01/30/21 0452    ondansetron (ZOFRAN ODT) 4 MG disintegrating tablet  Every 8 hours PRN        01/30/21 0452           Shon Baton, MD 01/30/21 [phone]

## 2021-01-30 NOTE — ED Notes (Signed)
Patient vomited ~422ml clear yellow liquid.

## 2021-01-30 NOTE — Discharge Instructions (Addendum)
You have a 3 mm kidney stone.  This will likely pass on its own.  Make sure you are staying hydrated.  Take medications as prescribed.  Follow-up with urology if pain returns or worsens.

## 2021-01-30 NOTE — ED Triage Notes (Signed)
Left flank pain that started at 0230 this am. + Dysuria Denies fever, denies blood in urine, denies hx of similar episodes. Has not taken ibuprofen or tylenol.

## 2021-01-30 NOTE — ED Notes (Signed)
Nausea relieved  

## 2021-01-30 NOTE — ED Triage Notes (Signed)
Pt taking selfies in triage, pain 10/10.

## 2021-04-16 ENCOUNTER — Other Ambulatory Visit: Payer: Self-pay

## 2021-04-16 ENCOUNTER — Emergency Department (HOSPITAL_COMMUNITY)
Admission: EM | Admit: 2021-04-16 | Discharge: 2021-04-16 | Disposition: A | Discharge status: Home / Self Care | Payer: Self-pay | Attending: Emergency Medicine | Admitting: Emergency Medicine

## 2021-04-16 ENCOUNTER — Encounter (HOSPITAL_COMMUNITY): Payer: Self-pay | Admitting: Emergency Medicine

## 2021-04-16 DIAGNOSIS — F17210 Nicotine dependence, cigarettes, uncomplicated: Secondary | ICD-10-CM | POA: Insufficient documentation

## 2021-04-16 DIAGNOSIS — Z79899 Other long term (current) drug therapy: Secondary | ICD-10-CM | POA: Insufficient documentation

## 2021-04-16 DIAGNOSIS — Z0283 Encounter for blood-alcohol and blood-drug test: Secondary | ICD-10-CM | POA: Insufficient documentation

## 2021-04-16 LAB — RAPID URINE DRUG SCREEN, HOSP PERFORMED
Amphetamines: NOT DETECTED
Barbiturates: NOT DETECTED
Benzodiazepines: NOT DETECTED
Cocaine: NOT DETECTED
Opiates: NOT DETECTED
Tetrahydrocannabinol: NOT DETECTED

## 2021-04-16 NOTE — ED Provider Notes (Signed)
[facility] EMERGENCY DEPARTMENT Provider Note   CSN: [id number] Arrival date & time: 04/16/21  1201     History Chief Complaint  Patient presents with   Drug / Alcohol Assessment    Harvir Patry is a 30 y.o. male.  This is a 30 yo male with history as below presenting for drug assessment.  Patient reports that he does not have his wallet/driver's license.  He is requested that he obtain a drug test by his employer for a job.  Patient denies acute medical complaints this time.  Denies recent drug or alcohol use.  Patient reports that he was instructed to go to emergency department by his employer to obtain the screening test.  Patient denies chest pain, dyspnea, abdominal pain, nausea or vomiting, no change in bowel or bladder function.  No recent medication or dietary changes. No acute systemic complaints offered.  The history is provided by the patient. No language interpreter was used.  Drug / Alcohol Assessment Associated symptoms: no abdominal pain, no palpitations, no seizures, no shortness of breath and no vomiting       History reviewed. No pertinent past medical history.  There are no problems to display for this patient.   Past Surgical History:  Procedure Laterality Date   FRACTURE SURGERY         History reviewed. No pertinent family history.  Social History   Tobacco Use   Smoking status: Every Day    Packs/day: 0.50    Types: Cigarettes   Smokeless tobacco: Current    Types: Snuff  Substance Use Topics   Alcohol use: Yes    Comment: occasionally   Drug use: Never    Home Medications Prior to Admission medications   Medication Sig Start Date End Date Taking? Authorizing Provider  benzonatate (TESSALON) 100 MG capsule Take 2 capsules (200 mg total) by mouth 3 (three) times daily as needed. 12/01/18   Idol, Raynelle Fanning, PA-C  ondansetron (ZOFRAN ODT) 4 MG disintegrating tablet Take 1 tablet (4 mg total) by mouth every 8 (eight) hours as needed for  nausea or vomiting. 01/30/21   Horton, Mayer Masker, MD  ondansetron (ZOFRAN) 4 MG tablet Take 1 tablet (4 mg total) by mouth every 6 (six) hours. 04/21/18   Triplett, Tammy, PA-C  oxyCODONE-acetaminophen (PERCOCET/ROXICET) 5-325 MG tablet Take 1 tablet by mouth every 6 (six) hours as needed for severe pain. 01/30/21   Horton, Mayer Masker, MD    Allergies    Codeine, Penicillins, Erythromycin, and Sulfa antibiotics  Review of Systems   Review of Systems  Constitutional:  Negative for chills and fever.  HENT:  Negative for ear pain and sore throat.   Eyes:  Negative for pain and visual disturbance.  Respiratory:  Negative for cough and shortness of breath.   Cardiovascular:  Negative for chest pain and palpitations.  Gastrointestinal:  Negative for abdominal pain and vomiting.  Genitourinary:  Negative for dysuria and hematuria.  Musculoskeletal:  Negative for arthralgias and back pain.  Skin:  Negative for color change and rash.  Neurological:  Negative for seizures and syncope.  All other systems reviewed and are negative.  Physical Exam Updated Vital Signs BP (!) 142/86 (BP Location: Right Arm)   Pulse 80   Temp 98.3 F (36.8 C) (Oral)   Resp 18   SpO2 98%   Physical Exam Vitals and nursing note reviewed.  Constitutional:      Appearance: Normal appearance. He is well-developed.  HENT:  Head: Normocephalic and atraumatic.     Right Ear: External ear normal.     Left Ear: External ear normal.  Eyes:     General: No scleral icterus. Cardiovascular:     Rate and Rhythm: Normal rate and regular rhythm.     Heart sounds: Normal heart sounds.  Pulmonary:     Effort: Pulmonary effort is normal. No respiratory distress.  Abdominal:     Palpations: Abdomen is soft.     Tenderness: There is no abdominal tenderness.  Musculoskeletal:     Cervical back: No rigidity.  Skin:    General: Skin is warm and dry.  Neurological:     Mental Status: He is alert and oriented to person,  place, and time.     GCS: GCS eye subscore is 4. GCS verbal subscore is 5. GCS motor subscore is 6.    ED Results / Procedures / Treatments   Labs (all labs ordered are listed, but only abnormal results are displayed) Labs Reviewed  RAPID URINE DRUG SCREEN, HOSP PERFORMED    EKG None  Radiology No results found.  Procedures Procedures   Medications Ordered in ED Medications - No data to display  ED Course  I have reviewed the triage vital signs and the nursing notes.  Pertinent labs & imaging results that were available during my care of the patient were reviewed by me and considered in my medical decision making (see chart for details).    MDM Rules/Calculators/A&P                           30 year old male presenting to the ER for illicit drug testing.  He has no acute medical complaints at this time.  Discussed with patient notations of drug screen in the emergency department.  Report these results may not be readily available.  He has  access to MyChart and will see his results online.  Asked patient to report to appropriate illicit drug screening facility in future.  Patient in no acute distress and is stable for discharge at this time. Advised patient to RT-ED for any worsening or worrisome symptoms. Discussed strict return precautions. Advised to F/u with PCP if needed in next 48 - 72 hours.    Final Clinical Impression(s) / ED Diagnoses Final diagnoses:  Encounter for drug screening    Rx / DC Orders ED Discharge Orders     None        Wallace Cullens, Sameul A, DO 04/16/21 1249

## 2021-04-16 NOTE — ED Triage Notes (Signed)
Requesting urine drug test for work

## 2024-03-25 ENCOUNTER — Other Ambulatory Visit: Payer: Self-pay

## 2024-03-25 ENCOUNTER — Emergency Department (HOSPITAL_COMMUNITY)
Admission: EM | Admit: 2024-03-25 | Discharge: 2024-03-25 | Disposition: A | Discharge status: Home / Self Care | Payer: Self-pay | Attending: Emergency Medicine | Admitting: Emergency Medicine

## 2024-03-25 ENCOUNTER — Emergency Department (HOSPITAL_COMMUNITY): Payer: Self-pay

## 2024-03-25 ENCOUNTER — Encounter (HOSPITAL_COMMUNITY): Payer: Self-pay

## 2024-03-25 DIAGNOSIS — S81811A Laceration without foreign body, right lower leg, initial encounter: Secondary | ICD-10-CM | POA: Insufficient documentation

## 2024-03-25 DIAGNOSIS — W293XXA Contact with powered garden and outdoor hand tools and machinery, initial encounter: Secondary | ICD-10-CM | POA: Insufficient documentation

## 2024-03-25 DIAGNOSIS — Y92009 Unspecified place in unspecified non-institutional (private) residence as the place of occurrence of the external cause: Secondary | ICD-10-CM | POA: Insufficient documentation

## 2024-03-25 DIAGNOSIS — Z23 Encounter for immunization: Secondary | ICD-10-CM | POA: Insufficient documentation

## 2024-03-25 MED ORDER — FENTANYL CITRATE PF 50 MCG/ML IJ SOSY
50.0000 ug | PREFILLED_SYRINGE | Freq: Once | INTRAMUSCULAR | Status: AC
  Administered 2024-03-25: 50 ug via INTRAVENOUS
  Filled 2024-03-25: qty 1

## 2024-03-25 MED ORDER — LIDOCAINE-EPINEPHRINE 1 %-1:100000 IJ SOLN
10.0000 mL | Freq: Once | INTRAMUSCULAR | Status: AC
  Administered 2024-03-25: 10 mL via INTRADERMAL
  Filled 2024-03-25: qty 1

## 2024-03-25 MED ORDER — CEFAZOLIN SODIUM-DEXTROSE 2-4 GM/100ML-% IV SOLN
2.0000 g | Freq: Once | INTRAVENOUS | Status: AC
  Administered 2024-03-25: 2 g via INTRAVENOUS
  Filled 2024-03-25: qty 100

## 2024-03-25 MED ORDER — TETANUS-DIPHTH-ACELL PERTUSSIS 5-2.5-18.5 LF-MCG/0.5 IM SUSY
0.5000 mL | PREFILLED_SYRINGE | Freq: Once | INTRAMUSCULAR | Status: AC
  Administered 2024-03-25: 0.5 mL via INTRAMUSCULAR
  Filled 2024-03-25: qty 0.5

## 2024-03-25 NOTE — Discharge Instructions (Signed)
 You were seen today for laceration to your right lower leg. While you were here we monitored your vitals, preformed a physical exam, and XR of the leg. These were all reassuring and there is no indication for any further testing or intervention in the emergency department at this time.   Things to do:  - Follow up with your primary care provider within the next 10-14 days for suture removal and follow up on healing of your leg wound - Keep your wound clean and dry with a bandage, apply bacitracin ointment to the wound at least once daily when you change the bandage.  Return to the emergency department if you have any new or worsening symptoms including fevers, severe leg pain, or concern for infection of the wound.

## 2024-03-25 NOTE — ED Triage Notes (Signed)
 Pt drove self to ED, cut RLE with a chainsaw 24 in. Lacteration 2.5 in length.

## 2024-03-25 NOTE — ED Provider Notes (Signed)
 [place] EMERGENCY DEPARTMENT AT [facility] Provider Note   CSN: [id number] Arrival date & time: 03/25/24  1600   Patient presents with: Laceration   Clinton Flynn is a 33 y.o. male with no significant past medical history who presents to the ED for evaluation of right lower extremity laceration.  Patient states that he was using a chainsaw while at home earlier today and accidentally stumbled and caught his right anterior lower leg.  He then drove himself to the ED.  Denies any falls or syncope after the injury.  Denies any other injuries.  Is unsure of when his last Tdap was.    Prior to Admission medications   Medication Sig Start Date End Date Taking? Authorizing Provider  benzonatate  (TESSALON ) 100 MG capsule Take 2 capsules (200 mg total) by mouth 3 (three) times daily as needed. 12/01/18   Idol, Julie, PA-C  ondansetron  (ZOFRAN  ODT) 4 MG disintegrating tablet Take 1 tablet (4 mg total) by mouth every 8 (eight) hours as needed for nausea or vomiting. 01/30/21   Horton, Charmaine FALCON, MD  ondansetron  (ZOFRAN ) 4 MG tablet Take 1 tablet (4 mg total) by mouth every 6 (six) hours. 04/21/18   Triplett, Tammy, PA-C  oxyCODONE -acetaminophen  (PERCOCET/ROXICET) 5-325 MG tablet Take 1 tablet by mouth every 6 (six) hours as needed for severe pain. 01/30/21   Horton, Charmaine FALCON, MD    Allergies: Codeine, Penicillins, Erythromycin, and Sulfa antibiotics     Updated Vital Signs BP (!) 125/90   Pulse 68   Temp 98.4 F (36.9 C) (Oral)   Resp 18   Ht 6' (1.829 m)   Wt 88.5 kg   SpO2 98%   BMI 26.45 kg/m   Physical Exam Vitals reviewed.  Constitutional:      General: He is not in acute distress.    Appearance: He is not toxic-appearing or diaphoretic.  HENT:     Head: Normocephalic and atraumatic.     Nose: Nose normal. No rhinorrhea.     Mouth/Throat:     Mouth: Mucous membranes are moist.     Pharynx: Oropharynx is clear.  Eyes:     General: No scleral icterus.     Extraocular Movements: Extraocular movements intact.     Pupils: Pupils are equal, round, and reactive to light.  Cardiovascular:     Rate and Rhythm: Normal rate and regular rhythm.     Pulses:          Radial pulses are 2+ on the right side and 2+ on the left side.       Dorsalis pedis pulses are 2+ on the right side and 2+ on the left side.       Posterior tibial pulses are 2+ on the right side and 2+ on the left side.     Heart sounds: No murmur heard.    No gallop.  Pulmonary:     Effort: Pulmonary effort is normal. No respiratory distress.  Abdominal:     General: Abdomen is flat.     Palpations: Abdomen is soft.     Tenderness: There is no abdominal tenderness. There is no guarding.  Musculoskeletal:        General: Tenderness (R lower leg) and signs of injury (7-8cm laceration to the R anterior lower leg as shown in photo below. Some injury to the muscle belly but no notable tendon involvement and full strength/ROM of the RLE distally) present. Normal range of motion.     Cervical back:  Normal range of motion and neck supple.  Skin:    General: Skin is warm and dry.     Capillary Refill: Capillary refill takes less than 2 seconds.     Comments: Normal aside from laceration as above  Neurological:     General: No focal deficit present.     Mental Status: He is alert and oriented to person, place, and time. Mental status is at baseline.     Sensory: No sensory deficit.     Motor: No weakness.     (all labs ordered are listed, but only abnormal results are displayed) Labs Reviewed - No data to display  EKG: None  Radiology: DG Tibia/Fibula Right Result Date: 03/25/2024 CLINICAL DATA:  chainsaw wound EXAM: RIGHT TIBIA AND FIBULA - 2 VIEW COMPARISON:  None Available. FINDINGS: No acute fracture or dislocation. There is no evidence of arthropathy or other focal bone abnormality. Soft tissue defect along the mid to lower anterior shin, consistent with patient's laceration. No  radiopaque foreign body. IMPRESSION: Soft tissue defect along the mid to lower anterior shin, consistent with patient's laceration. No radiopaque foreign body. No acute fracture or dislocation. Electronically Signed   By: Rogelia Myers M.D.   On: 03/25/2024 17:00    {Document cardiac monitor, telemetry assessment procedure when appropriate:32947} .Laceration Repair  Date/Time: 03/25/2024 6:41 PM  Performed by: Raoul Rake, MD Authorized by: Garrick Charleston, MD   Consent:    Consent obtained:  Verbal   Consent given by:  Patient   Risks, benefits, and alternatives were discussed: yes     Risks discussed:  Infection, pain, poor cosmetic result and poor wound healing   Alternatives discussed:  No treatment Anesthesia:    Anesthesia method:  Local infiltration   Local anesthetic:  Lidocaine  1% WITH epi Laceration details:    Location:  Leg   Leg location:  R lower leg   Length (cm):  8 Pre-procedure details:    Preparation:  Imaging obtained to evaluate for foreign bodies Exploration:    Hemostasis achieved with:  Epinephrine  and direct pressure   Imaging obtained: x-ray     Imaging outcome: foreign body not noted     Wound exploration: wound explored through full range of motion and entire depth of wound visualized     Contaminated: no   Treatment:    Area cleansed with:  Saline and chlorhexidine   Amount of cleaning:  Extensive   Irrigation solution:  Sterile saline   Irrigation volume:  1000cc   Irrigation method:  Tap   Debridement:  None Skin repair:    Repair method:  Sutures   Suture size:  4-0   Suture material:  Nylon   Suture technique:  Simple interrupted and subcuticular   Number of sutures:  14 Approximation:    Approximation:  Close Repair type:    Repair type:  Intermediate Post-procedure details:    Dressing:  Antibiotic ointment and non-adherent dressing   Procedure completion:  Tolerated well, no immediate complications       Medications  Ordered in the ED  Tdap (BOOSTRIX ) injection 0.5 mL (0.5 mLs Intramuscular Given 03/25/24 1619)  ceFAZolin  (ANCEF ) IVPB 2g/100 mL premix (0 g Intravenous Stopped 03/25/24 1650)  fentaNYL  (SUBLIMAZE ) injection 50 mcg (50 mcg Intravenous Given 03/25/24 1712)  lidocaine -EPINEPHrine  (XYLOCAINE  W/EPI) 1 %-1:100000 (with pres) injection 10 mL (10 mLs Intradermal Given 03/25/24 1713)    Clinical Course as of 03/25/24 1938  Fri Mar 25, 2024  1704 DG Tibia/Fibula  Right Soft tissue defect along the mid to lower anterior shin, consistent with patient's laceration. No radiopaque foreign body. No acute fracture or dislocation.   [AD]    Clinical Course User Index [AD] Raoul Rake, MD    Medical Decision Making Patient with no significant past medical history presenting with a laceration to the right lower leg from an   Amount and/or Complexity of Data Reviewed Radiology: ordered. Decision-making details documented in ED Course.  Risk Prescription drug management.   ***  {Document critical care time when appropriate  Document review of labs and clinical decision tools ie CHADS2VASC2, etc  Document your independent review of radiology images and any outside records  Document your discussion with family members, caretakers and with consultants  Document social determinants of health affecting pt's care  Document your decision making why or why not admission, treatments were needed:32947:::1}   Final diagnoses:  Laceration of right lower extremity, initial encounter    ED Discharge Orders     None

## 2024-03-27 ENCOUNTER — Telehealth: Payer: Self-pay

## 2024-03-27 NOTE — Telephone Encounter (Signed)
 Called patient back as Dr Laurice reviewed the chart and did not see an indication for antibiotics at this time. He is dressing the wound as ordered with bacetracin, discussed change, fever, redness feel worse can come back in and get re-evaluated/
